# Patient Record
Sex: Female | Born: 1935 | Race: White | Hispanic: No | Marital: Married
Health system: Southern US, Community
[De-identification: ages and names within clinical notes are randomized; demographics above are authoritative.]

---

## 2008-07-13 ENCOUNTER — Ambulatory Visit: Payer: Self-pay | Admitting: Specialist

## 2008-07-22 ENCOUNTER — Inpatient Hospital Stay: Payer: Self-pay | Admitting: Specialist

## 2008-12-20 IMAGING — CR DG KNEE 1-2V*R*
1 series · 2 of 2 positions shown · non-contrast
Comparison: none

REASON FOR EXAM: right total knee
COMMENTS:

PROCEDURE:     DXR - DXR KNEE RIGHT AP AND LATERAL  - July 22, 2008  [DATE]
RESULT:     The patient is status post RIGHT knee replacement. No fracture
about the prosthetic components is seen. There is no dislocation of the
prosthetic knee joint.

[Series 1: view not recorded · 0.17mm/px · 2 of 2 slices shown]
[im 1/2]
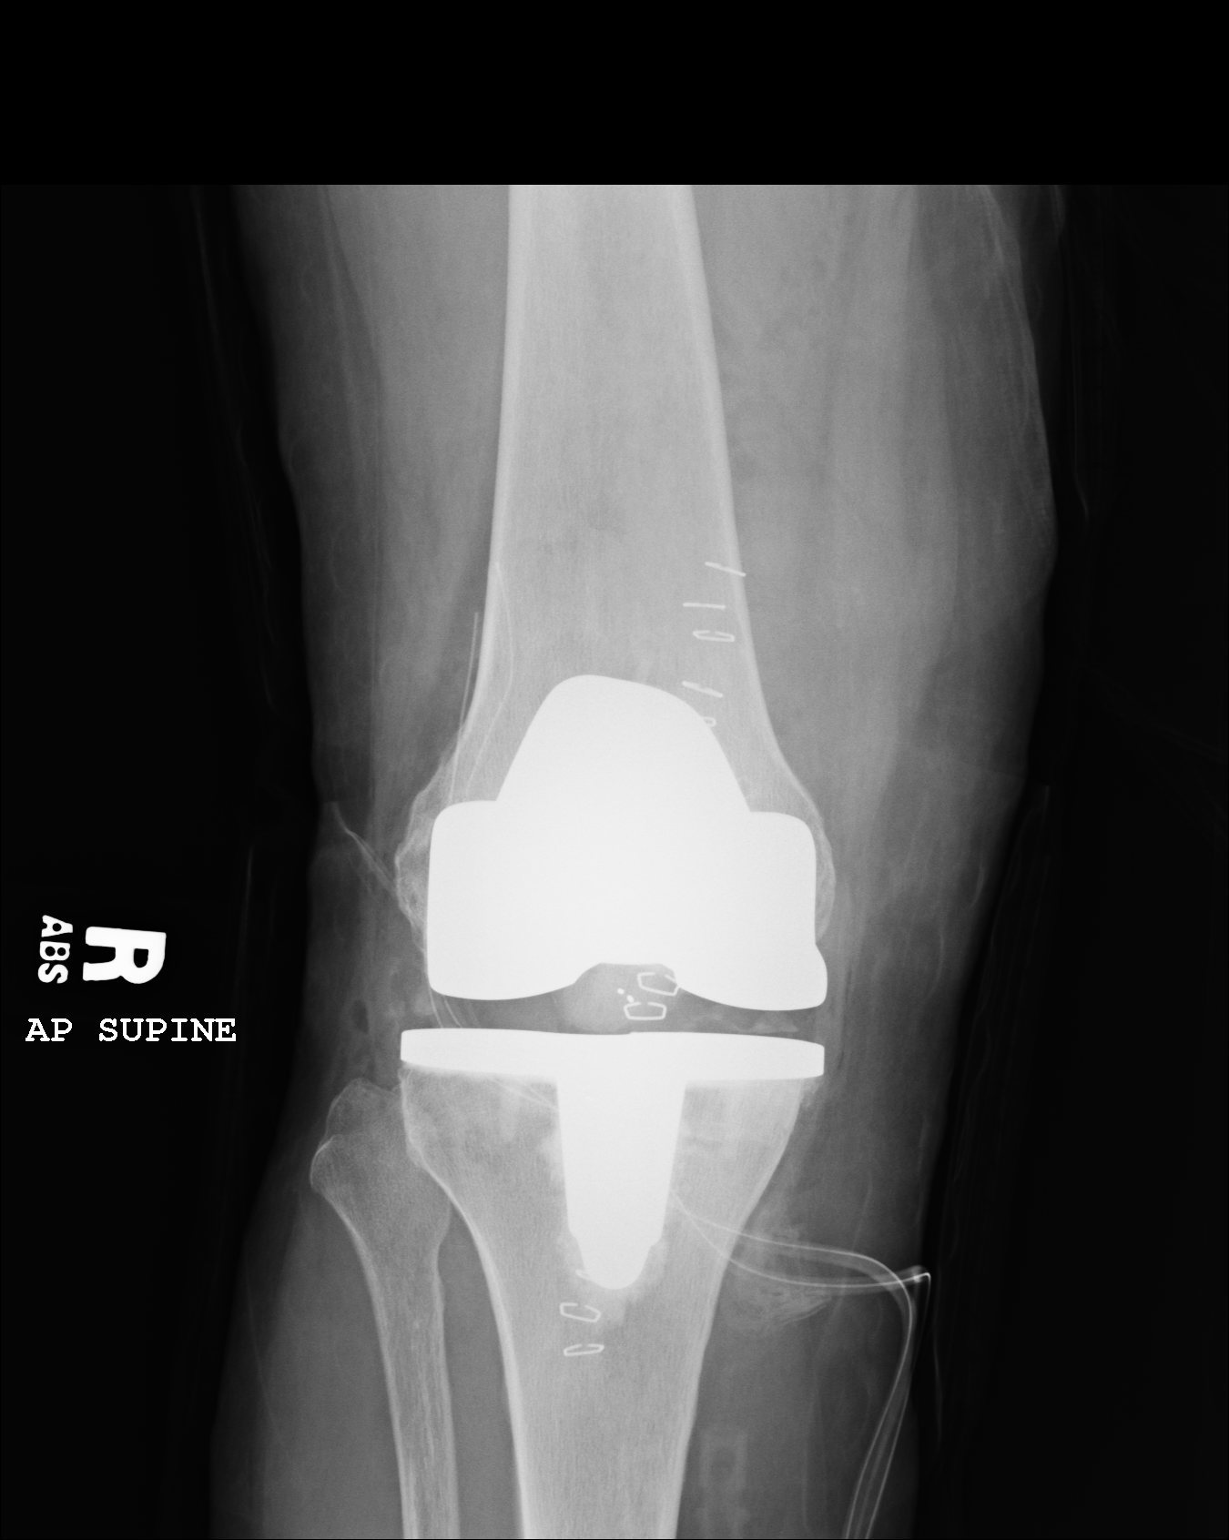
[im 2/2]
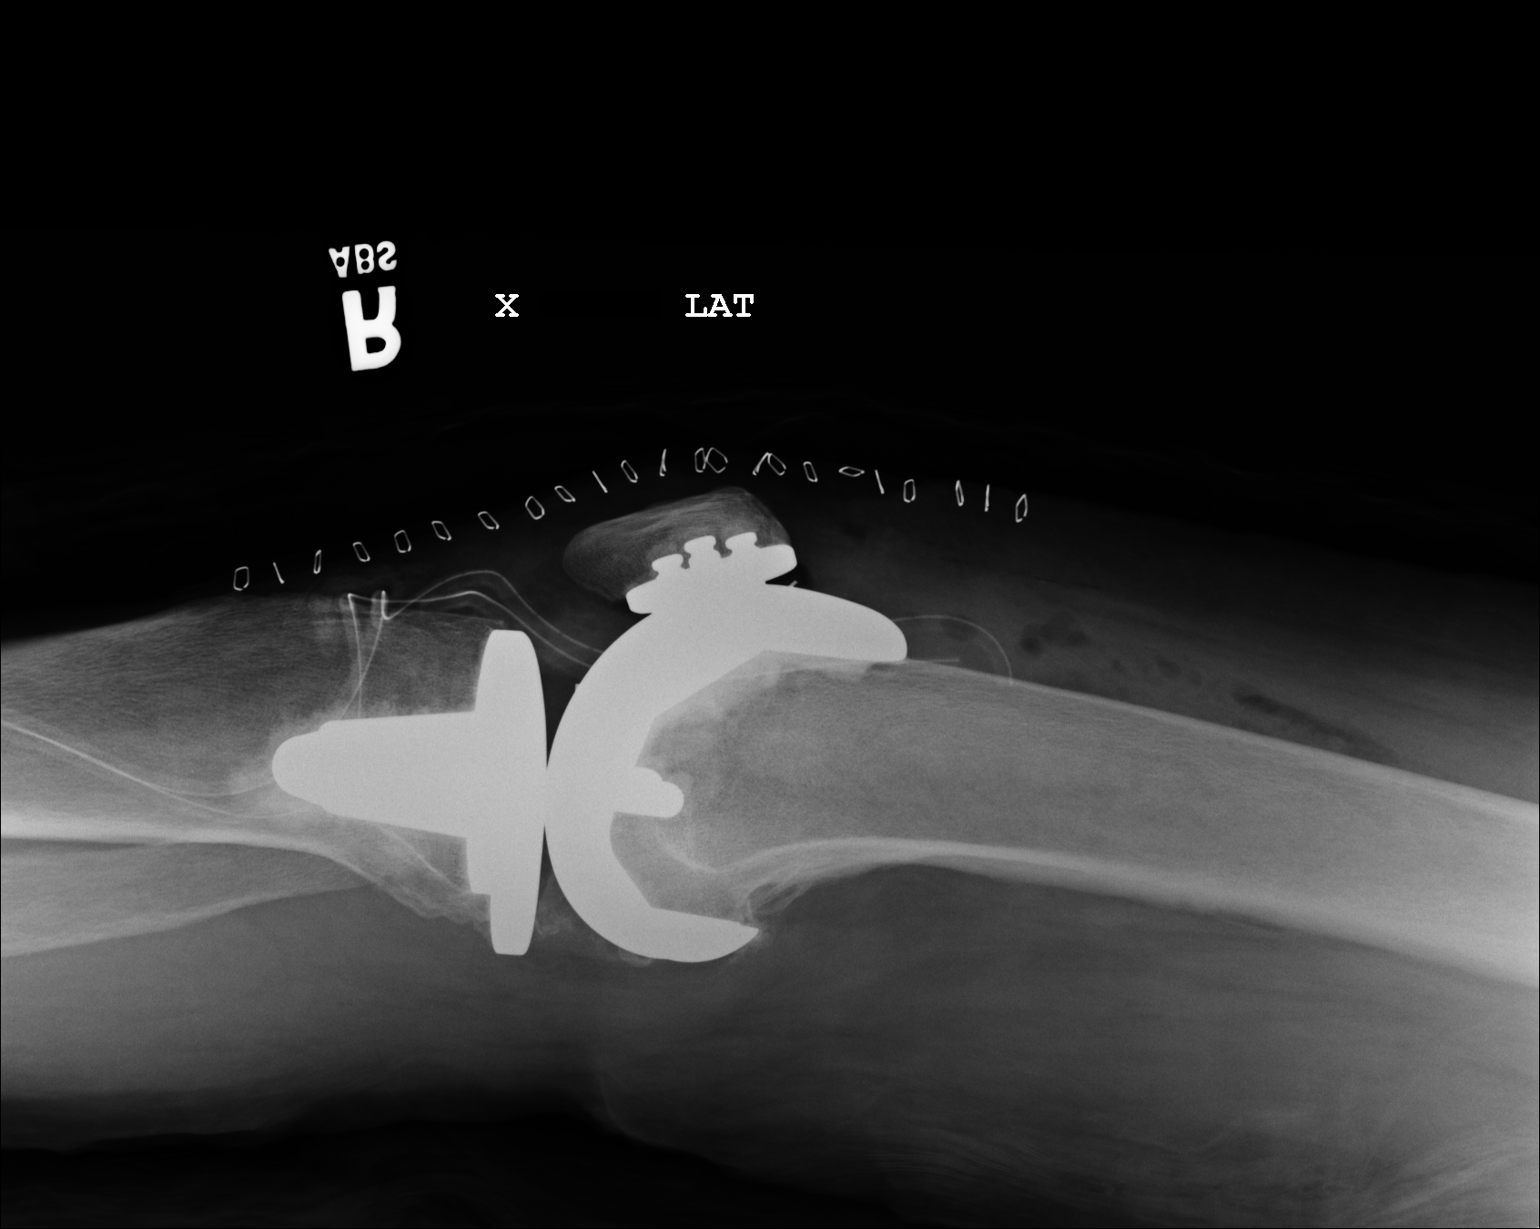

[2 of 2 positions shown; findings below may reference images not displayed]

IMPRESSION: The patient is status post RIGHT knee replacement. No abnormal postoperative
changes are identified.

## 2011-03-28 ENCOUNTER — Ambulatory Visit: Payer: Self-pay | Admitting: Urology

## 2013-08-26 ENCOUNTER — Ambulatory Visit: Payer: Self-pay | Admitting: Specialist

## 2013-08-26 LAB — URINALYSIS, COMPLETE
Bacteria: NONE SEEN
Blood: NEGATIVE
Glucose,UR: NEGATIVE mg/dL (ref 0–75)
Ketone: NEGATIVE
Nitrite: NEGATIVE
Protein: NEGATIVE
Specific Gravity: 1.017 (ref 1.003–1.030)
Squamous Epithelial: 1

## 2013-08-26 LAB — MRSA PCR SCREENING

## 2013-08-26 LAB — APTT: Activated PTT: 29.9 secs (ref 23.6–35.9)

## 2013-09-04 ENCOUNTER — Inpatient Hospital Stay: Payer: Self-pay | Admitting: Specialist

## 2013-09-04 LAB — CREATININE, SERUM
Creatinine: 0.98 mg/dL (ref 0.60–1.30)
EGFR (African American): 56 — ABNORMAL LOW

## 2013-09-07 LAB — CBC WITH DIFFERENTIAL/PLATELET
Basophil %: 0.2 %
Eosinophil #: 0.2 10*3/uL (ref 0.0–0.7)
Eosinophil %: 1.6 %
HCT: 29.6 % — ABNORMAL LOW (ref 35.0–47.0)
Lymphocyte %: 14 %
MCH: 32.3 pg (ref 26.0–34.0)
MCHC: 34.8 g/dL (ref 32.0–36.0)
MCV: 93 fL (ref 80–100)
Neutrophil %: 71.6 %
RDW: 12.8 % (ref 11.5–14.5)
WBC: 9.8 10*3/uL (ref 3.6–11.0)

## 2013-09-07 LAB — BASIC METABOLIC PANEL
Creatinine: 0.98 mg/dL (ref 0.60–1.30)
EGFR (African American): 56 — ABNORMAL LOW
Osmolality: 271 (ref 275–301)

## 2014-02-02 IMAGING — CR DG KNEE 1-2V*L*
1 series · 2 of 2 positions shown · non-contrast
Comparison: None.

CLINICAL DATA: Postop left total knee arthroplasty.

EXAM:
LEFT KNEE - 1-2 VIEW

[Series 1: ap · 0.17mm/px · 2 of 2 slices shown]
[im 1/2]
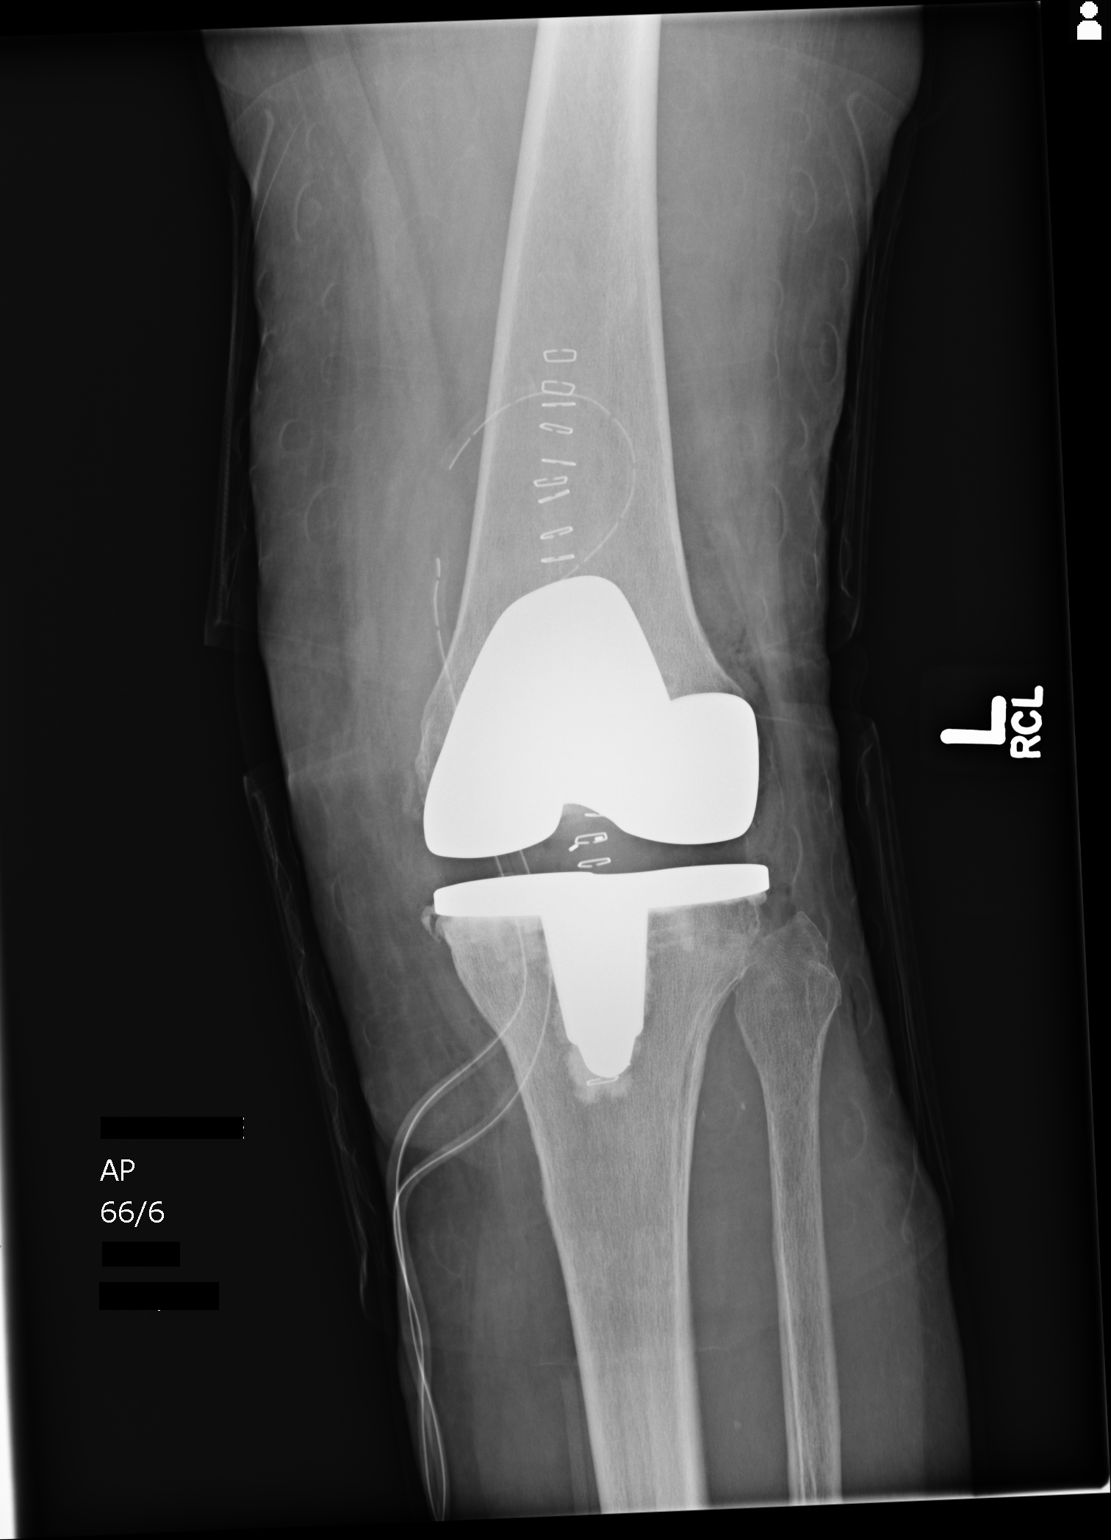
[im 2/2]
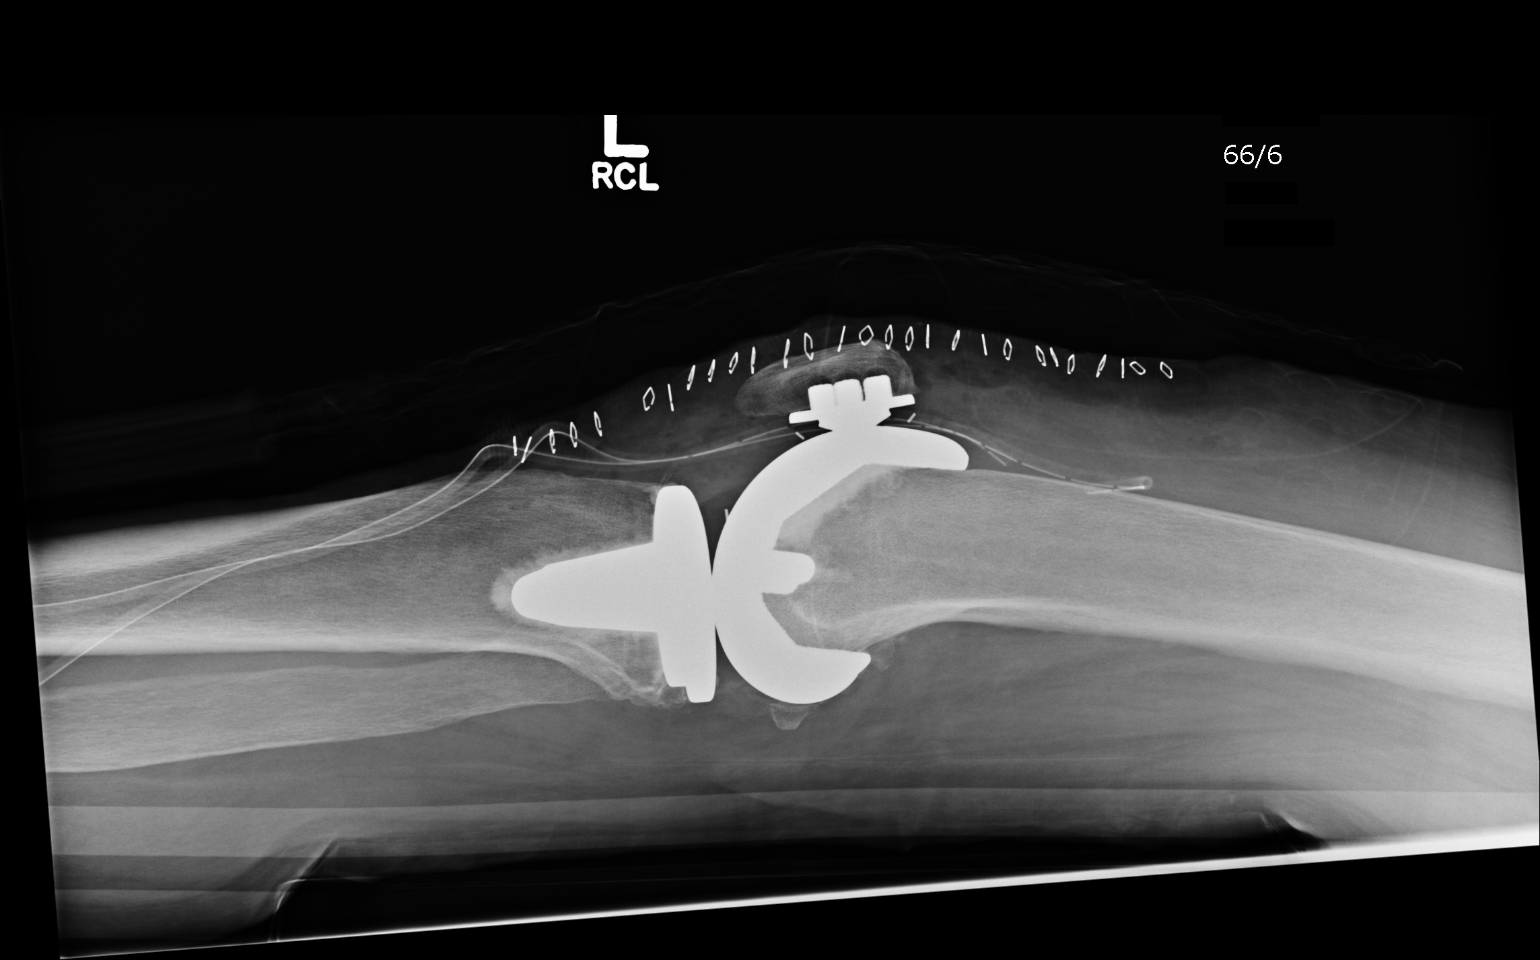

[2 of 2 positions shown; findings below may reference images not displayed]

FINDINGS: Patient is status post left total knee arthroplasty. The hardware
appears well positioned. There is a surgical drain in place. Some
gas is present within the anterior soft tissues and within the
joint. Anterior skin staples are noted.
IMPRESSION: No demonstrated complication following left total knee arthroplasty.

## 2015-02-19 NOTE — Op Note (Signed)
PATIENT NAME:  Anna Watkins, Anna Watkins MR#:  [id number] DATE OF BIRTH:  Dec 23, 1914  DATE OF PROCEDURE:  09/04/2013  PREOPERATIVE DIAGNOSIS: Severe degenerative arthritis, left knee.   POSTOPERATIVE DIAGNOSIS: Severe degenerative arthritis, left knee.   PROCEDURE: Left total knee arthroplasty, DePuy LCS.  SURGEON: Myra Rudehristopher Gayleen Sholtz, M.D.   ASSISTANT: Deeann SaintHoward Miller, M.D.   ANESTHESIA: Spinal.   COMPLICATIONS: None.   TOURNIQUET TIME: 88 minutes.   BLOOD LOSS: 50 mL.   DESCRIPTION OF PROCEDURE: After adequate induction of spinal anesthesia, Foley catheter was inserted. One gram of Ancef was given intravenously. The tourniquet was applied to the left upper leg and the left leg was thoroughly prepped with alcohol and DuraPrep and draped in standard sterile fashion. The extremity is wrapped out with the Esmarch bandage and pneumatic tourniquet elevated to 325 mmHg. A standard anterior longitudinal incision is made and the dissection carried down to the patella. The medial and lateral retinaculum is carefully dissected out. Medial parapatellar incision is made and the knee is flexed and the patella reflected laterally. Retractors are placed. Standard debridement is performed. There is no evidence of infection present. The retractors are placed revealing the tibial plateau. The cutting block instrumentation was put into place and pinned and the proximal tibial cut made. The distal femur is sized as a standard plus. The cutting block is pinned into place using standard technique. Anterior and posterior tibial cuts are made. A 10 mm flexion block is impacted into place and is seen to be a good fit with good alignment. The 5 degree valgus distal femoral cutting block is impacted into place and pinned and the distal femoral cut is made. A 10 mm extension gap block is put into place and is seen to be stable with full extension. The wound is thoroughly irrigated multiple times throughout the case with pulsatile  lavage. The standard plus femoral shaping guide is impacted into place and all cuts made. Proximal tibia is then revealed again and the +4 tibial component is chosen as appropriate and the central drill hole made. All trial components are then inserted and there is seen to be excellent range of motion with full extension. The patella is performed in standard fashion. All trial components are removed. The joint is thoroughly irrigated multiple times. The tibial +4 tray is then cemented into place. The 10 mm rotating platform is inserted and then the porous-coated standard plus femoral component along with a standard plus patellar component are impacted into place and seen to be stable. There is full knee extension. All cement is removed. The wound is thoroughly irrigated multiple times with the pulsatile lavage. The retinaculum is completely repaired with #2 Tycron. Two Autovac drains are brought out through separate stab wound incision. The subcutaneous tissue is closed with 2-0 Vicryl and the skin is closed with the skin stapler. A soft bulky dressing is applied. The patient is returned to the recovery room in satisfactory condition having tolerated the procedure quite well. It should be noted that Polar Care and knee immobilizer were applied prior to the patient's returned to the recovery room.  ____________________________ Clare Gandyhristopher E. Westyn Keatley, MD ces:sb D: 09/04/2013 10:24:03 ET T: 09/04/2013 10:47:48 ET JOB#: [id number]  cc: Clare Gandyhristopher E. Alysiah Suppa, MD, <Dictator> Clare GandyHRISTOPHER E Rojelio Uhrich MD ELECTRONICALLY SIGNED 09/08/2013 11:51

## 2015-02-19 NOTE — Discharge Summary (Signed)
PATIENT NAME:  Anna Watkins, Anna Watkins MR#:  [id number] DATE OF BIRTH:  [birth year]  DATE OF ADMISSION:  09/04/2013 DATE OF DISCHARGE:  09/08/2013  DISCHARGE DIAGNOSES: 1.  Severe degenerative arthritis, left knee. 2.  History of urinary incontinence.  3.  Osteoarthritis.   OPERATIONS AND PROCEDURES PERFORMED: Left total knee arthroplasty on 09/04/2013.   HISTORY AND PHYSICAL EXAMINATION: As written on admission.   LABORATORY DATA: As noted in the chart.   COURSE IN THE HOSPITAL: Following appropriate informed consent and laboratory evaluation and medical clearance, the patient was taken to the operating room for LCS left total knee arthroplasty on 09/04/2013. She tolerated the procedure quite well without complications. On the first postoperative day, she was advanced up into the chair and begun on the total knee protocol and physical therapy. She was weight-bearing using the walker and range of motion was begun. Her drain was pulled on postoperative day number 1 and her dressing changed. Her wound at all times was seen to be healing in quite well. She was on Lovenox as prophylaxis for venous thrombosis and pulmonary embolism and during her hospitalization her hemoglobin remained stable. Her mental status remained stable throughout the hospitalization. It was felt that on 09/08/2013 she could be safely discharged to her home with home health physical therapy. She is to resume taking her present medications as she was at home and in addition hydrocodone as necessary for pain and Lovenox 40 mg subcu every 24 hours for 10 days. She may shower and change the dressing as necessary. Home health physical therapy was arranged. She is to return to the office in 10 days for x-ray and staple removal.  ____________________________ Clare Gandyhristopher E. Ritchie Klee, MD ces:sb D: 10/02/2013 13:37:23 ET T: 10/02/2013 14:14:33 ET JOB#: [id number]  cc: Clare Gandyhristopher E. Mina Carlisi, MD, <Dictator> Clare GandyHRISTOPHER E Ramelo Oetken MD ELECTRONICALLY  SIGNED 10/03/2013 11:17

## 2021-04-19 ENCOUNTER — Ambulatory Visit (INDEPENDENT_AMBULATORY_CARE_PROVIDER_SITE_OTHER): Admitting: Podiatry

## 2021-04-19 ENCOUNTER — Other Ambulatory Visit: Payer: Self-pay

## 2021-04-19 DIAGNOSIS — L600 Ingrowing nail: Secondary | ICD-10-CM

## 2021-04-19 MED ORDER — DOXYCYCLINE HYCLATE 100 MG PO TABS: 100.0000 mg | ORAL_TABLET | Freq: Two times a day (BID) | ORAL | 0 refills | Status: DC

## 2021-04-19 MED ORDER — GENTAMICIN SULFATE 0.1 % EX CREA
1.0000 | TOPICAL_CREAM | Freq: Two times a day (BID) | CUTANEOUS | 1 refills | Status: DC
Start: 2021-04-19 — End: 2024-06-06

## 2021-04-19 NOTE — Patient Instructions (Signed)

## 2021-04-19 NOTE — Progress Notes (Signed)
   HPI: 87 y.o. female presenting today with her granddaughter for evaluation of right great toenail.  Patient has noticed pus with drainage coming from the toenail.  It is very painful.  She cannot recall an incident or injury.  Today she noticed increased swelling to the area.  She presents for further treatment and evaluation  No past medical history on file.   Physical Exam: General: The patient is alert and oriented x3 in no acute distress.  Dermatology: Loosely adhered right hallux nail plate with underlying subungual purulence and drainage noted.  There is some localized erythema around the nail plate as well.  Vascular: Palpable pedal pulses bilaterally. No edema or erythema noted. Capillary refill within normal limits.  Neurological: Epicritic and protective threshold grossly intact bilaterally.   Musculoskeletal Exam: No pedal deformities noted.  Muscle strength 5/5 all compartments  Assessment: 1.  Loosely adhered nail plate right hallux with infection   Plan of Care:  1. Patient evaluated. 2.  Today we discussed different treatment options.  I believe it is in the best interest to perform a total temporary nail avulsion to the right hallux nail plate.  Risks and benefits explained.  Patient agrees 3.  The toe was prepped in aseptic manner and 3 mL of 2% lidocaine plain was utilized in a digital block fashion.  The nail was avulsed in its entirety and cleansed and light dressing applied.  Post care instructions provided 4.  Prescription for doxycycline 100 mg 2 times daily #20 5.  Prescription for gentamicin cream apply 2 times daily 6.  Return to clinic in 2 weeks  *Granddaughter's name is Edythe Clarity, DPM [facility]  Dr. Felecia Shelling, DPM    [address] [place], [place] [address]                Office [phone]  Fax [phone]

## 2021-05-03 ENCOUNTER — Ambulatory Visit (INDEPENDENT_AMBULATORY_CARE_PROVIDER_SITE_OTHER): Admitting: Podiatry

## 2021-05-03 ENCOUNTER — Other Ambulatory Visit: Payer: Self-pay

## 2021-05-03 DIAGNOSIS — L600 Ingrowing nail: Secondary | ICD-10-CM

## 2021-05-03 NOTE — Progress Notes (Signed)
   HPI: 87 y.o. female presenting today status post total temporary nail avulsion that was performed on 04/19/2021 to the right hallux.  Patient states that she is doing well.  She took the oral antibiotics as prescribed and has been applying antibiotic cream.  No new complaints at this time.  Overall she says that she feels much better  No past medical history on file.   Physical Exam: General: The patient is alert and oriented x3 in no acute distress.  Dermatology: Absence of the right hallux nail plate with a dry stable subungual nailbed.  No erythema or edema noted.  No evidence of infection.  No purulence.  Good routine healing  Vascular: Palpable pedal pulses bilaterally.  Capillary refill within normal limits.  Neurological: Epicritic and protective threshold grossly intact bilaterally.   Musculoskeletal Exam: No pedal deformity noted  Assessment: 1. S/p total temporary nail avulsion RT hallux nail plate   Plan of Care:  1. Patient evaluated.  2.  Light debridement performed using a tissue nipper.  No bleeding noted. 3.  Patient may resume full activity no restrictions 4.  Return to clinic as needed      Felecia Shelling, DPM [facility]  Dr. Felecia Shelling, DPM    [address] [place], [place] [address]                Office [phone]  Fax [phone]

## 2024-04-29 DEATH — deceased
